# Patient Record
Sex: Female | Born: 1950
Health system: Southern US, Community
[De-identification: ages and names within clinical notes are randomized; demographics above are authoritative.]

## PROBLEM LIST (undated history)

## (undated) DIAGNOSIS — C801 Malignant (primary) neoplasm, unspecified: Secondary | ICD-10-CM

## (undated) DIAGNOSIS — I10 Essential (primary) hypertension: Secondary | ICD-10-CM

## (undated) DIAGNOSIS — Z923 Personal history of irradiation: Secondary | ICD-10-CM

## (undated) HISTORY — PX: TONSILLECTOMY: SUR1361

## (undated) HISTORY — DX: Personal history of irradiation: Z92.3

## (undated) HISTORY — DX: Malignant (primary) neoplasm, unspecified: C80.1

## (undated) HISTORY — DX: Essential (primary) hypertension: I10

---

## 1976-10-05 HISTORY — PX: OTHER SURGICAL HISTORY: SHX169

## 1999-02-20 ENCOUNTER — Other Ambulatory Visit: Admission: RE | Admit: 1999-02-20 | Discharge: 1999-02-20 | Payer: Self-pay | Admitting: *Deleted

## 2000-07-07 ENCOUNTER — Other Ambulatory Visit: Admission: RE | Admit: 2000-07-07 | Discharge: 2000-07-07 | Payer: Self-pay | Admitting: *Deleted

## 2001-09-29 ENCOUNTER — Other Ambulatory Visit: Admission: RE | Admit: 2001-09-29 | Discharge: 2001-09-29 | Payer: Self-pay | Admitting: *Deleted

## 2002-11-09 ENCOUNTER — Other Ambulatory Visit: Admission: RE | Admit: 2002-11-09 | Discharge: 2002-11-09 | Payer: Self-pay | Admitting: *Deleted

## 2003-08-02 ENCOUNTER — Ambulatory Visit (HOSPITAL_BASED_OUTPATIENT_CLINIC_OR_DEPARTMENT_OTHER): Admission: RE | Admit: 2003-08-02 | Discharge: 2003-08-02 | Payer: Self-pay | Admitting: Plastic Surgery

## 2003-08-02 ENCOUNTER — Encounter (INDEPENDENT_AMBULATORY_CARE_PROVIDER_SITE_OTHER): Payer: Self-pay | Admitting: Specialist

## 2003-08-02 ENCOUNTER — Ambulatory Visit (HOSPITAL_COMMUNITY): Admission: RE | Admit: 2003-08-02 | Discharge: 2003-08-02 | Payer: Self-pay | Admitting: Plastic Surgery

## 2003-12-20 ENCOUNTER — Other Ambulatory Visit: Admission: RE | Admit: 2003-12-20 | Discharge: 2003-12-20 | Payer: Self-pay | Admitting: *Deleted

## 2008-12-11 ENCOUNTER — Encounter: Admission: RE | Admit: 2008-12-11 | Discharge: 2008-12-11 | Payer: Self-pay | Admitting: Family Medicine

## 2008-12-11 ENCOUNTER — Other Ambulatory Visit: Admission: RE | Admit: 2008-12-11 | Discharge: 2008-12-11 | Payer: Self-pay | Admitting: Family Medicine

## 2010-08-19 ENCOUNTER — Other Ambulatory Visit: Admission: RE | Admit: 2010-08-19 | Discharge: 2010-08-19 | Payer: Self-pay | Admitting: Family Medicine

## 2010-10-05 HISTORY — PX: BREAST LUMPECTOMY: SHX2

## 2010-12-15 ENCOUNTER — Other Ambulatory Visit: Payer: Self-pay | Admitting: Radiology

## 2010-12-16 ENCOUNTER — Other Ambulatory Visit: Payer: Self-pay | Admitting: Radiology

## 2010-12-16 DIAGNOSIS — C50911 Malignant neoplasm of unspecified site of right female breast: Secondary | ICD-10-CM

## 2010-12-20 ENCOUNTER — Ambulatory Visit
Admission: RE | Admit: 2010-12-20 | Discharge: 2010-12-20 | Disposition: A | Discharge status: Home / Self Care | Payer: 59 | Source: Ambulatory Visit | Attending: Radiology | Admitting: Radiology

## 2010-12-20 DIAGNOSIS — C50911 Malignant neoplasm of unspecified site of right female breast: Secondary | ICD-10-CM

## 2010-12-20 MED ORDER — GADOBENATE DIMEGLUMINE 529 MG/ML IV SOLN
12.0000 mL | Freq: Once | INTRAVENOUS | Status: AC | PRN
  Administered 2010-12-20: 12 mL via INTRAVENOUS

## 2010-12-22 ENCOUNTER — Other Ambulatory Visit (HOSPITAL_COMMUNITY): Payer: Self-pay | Admitting: General Surgery

## 2010-12-22 DIAGNOSIS — C50919 Malignant neoplasm of unspecified site of unspecified female breast: Secondary | ICD-10-CM

## 2011-01-01 ENCOUNTER — Ambulatory Visit
Admission: RE | Admit: 2011-01-01 | Discharge: 2011-01-01 | Disposition: A | Discharge status: Home / Self Care | Payer: 59 | Source: Ambulatory Visit | Attending: General Surgery | Admitting: General Surgery

## 2011-01-01 ENCOUNTER — Encounter (HOSPITAL_BASED_OUTPATIENT_CLINIC_OR_DEPARTMENT_OTHER)
Admission: RE | Admit: 2011-01-01 | Discharge: 2011-01-01 | Disposition: A | Discharge status: Home / Self Care | Payer: 59 | Source: Ambulatory Visit | Attending: General Surgery | Admitting: General Surgery

## 2011-01-01 ENCOUNTER — Other Ambulatory Visit: Payer: Self-pay | Admitting: General Surgery

## 2011-01-01 DIAGNOSIS — Z01811 Encounter for preprocedural respiratory examination: Secondary | ICD-10-CM

## 2011-01-01 LAB — COMPREHENSIVE METABOLIC PANEL
ALT: 11 U/L (ref 0–35)
AST: 33 U/L (ref 0–37)
CO2: 29 mEq/L (ref 19–32)
Calcium: 9.6 mg/dL (ref 8.4–10.5)
Chloride: 104 mEq/L (ref 96–112)
Creatinine, Ser: 0.69 mg/dL (ref 0.4–1.2)
GFR calc non Af Amer: >60 mL/min (ref >60)
Glucose, Bld: 82 mg/dL (ref 70–99)
Sodium: 140 mEq/L (ref 135–145)
Total Bilirubin: 0.4 mg/dL (ref 0.3–1.2)

## 2011-01-01 LAB — CBC
HCT: 39.7 % (ref 36.0–46.0)
Hemoglobin: 13.3 g/dL (ref 12.0–15.0)
MCH: 31.6 pg (ref 26.0–34.0)
MCHC: 33.5 g/dL (ref 30.0–36.0)
RBC: 4.21 MIL/uL (ref 3.87–5.11)

## 2011-01-01 LAB — DIFFERENTIAL
Basophils Absolute: 0 10*3/uL (ref 0.0–0.1)
Basophils Relative: 0 % (ref 0–1)
Eosinophils Relative: 1 % (ref 0–5)
Monocytes Absolute: 0.5 10*3/uL (ref 0.1–1.0)

## 2011-01-01 LAB — CANCER ANTIGEN 27.29: CA 27.29: 26 U/mL (ref 0–39)

## 2011-01-02 ENCOUNTER — Other Ambulatory Visit: Payer: Self-pay | Admitting: General Surgery

## 2011-01-02 ENCOUNTER — Ambulatory Visit (HOSPITAL_BASED_OUTPATIENT_CLINIC_OR_DEPARTMENT_OTHER)
Admission: RE | Admit: 2011-01-02 | Discharge: 2011-01-02 | Disposition: A | Discharge status: Home / Self Care | Payer: 59 | Source: Ambulatory Visit | Attending: General Surgery | Admitting: General Surgery

## 2011-01-02 ENCOUNTER — Other Ambulatory Visit (HOSPITAL_COMMUNITY): Payer: 59

## 2011-01-02 DIAGNOSIS — C801 Malignant (primary) neoplasm, unspecified: Secondary | ICD-10-CM

## 2011-01-02 DIAGNOSIS — Z01818 Encounter for other preprocedural examination: Secondary | ICD-10-CM | POA: Insufficient documentation

## 2011-01-02 DIAGNOSIS — D0590 Unspecified type of carcinoma in situ of unspecified breast: Secondary | ICD-10-CM | POA: Insufficient documentation

## 2011-01-02 DIAGNOSIS — Z126 Encounter for screening for malignant neoplasm of bladder: Secondary | ICD-10-CM | POA: Insufficient documentation

## 2011-01-02 HISTORY — DX: Malignant (primary) neoplasm, unspecified: C80.1

## 2011-01-06 NOTE — Op Note (Signed)
NAMEGIOVANA, Jackson            ACCOUNT NO.:  [id number]  MEDICAL RECORD NO.:  [id number]          PATIENT TYPE:  LOCATION:                                 FACILITY:  PHYSICIAN:  Juanetta Gosling, MD     DATE OF BIRTH:  DATE OF PROCEDURE:  01/02/2011 DATE OF DISCHARGE:                              OPERATIVE REPORT  Pre op Dx: Right breast DCIS  Post op Dx: SAA  Procedure: Right breast wire guided lumpectomy  Surgeon: Juanetta Gosling MD  Anesthesia: General with LMA  EBL: minimal  Complications: none  Drains: none  Disposition: to PACU stable  HISTORY:  This is a 60 year old female who had a followup for a possible abnormal density and was noted to have an element that caused her pleomorphic calcifications in the right breast upper outer quadrant. This underwent stereotactic biopsy with clip placement showing high- grade DCIS ER/PR positive.  An MRI really does not show any other abnormalities that are present.  She and I discussed all of our options and elected to perform a right breast wire-guided lumpectomy.  We discussed sentinel node biopsy, but both of [place] decided that I think the best plan will be just to proceed with a wire-guided lumpectomy.  She was discussed at the Multidisciplinary Conference prior to taking her to the operating room.  PROCEDURE:  After informed consent was obtained, the patient was first taken to the [facility] where she had a wire placed and I discussed the placement with Dr. Derinda Late.  Prior to beginning the surgery, I had her mammograms available for my review in the operating room.  1 g of intravenous cefazolin was administered.  Sequential compression devices were placed on lower extremities prior to induction of anesthesia.  She was then placed under general anesthesia with an LMA.  Her right breast was prepped and draped in a standard sterile surgical fashion.  A surgical time-out was then performed.  She had a prior  right breast incision.  The wire was near there so I made an elliptical incision and removed the paddle of skin that included the old incision as well.  Cautery was then used to remove the wire as well as the surrounding tissue.  I took this down to the pectoralis muscle including the pectoralis fascia.  This was then passed off the table as a specimen.  This was marked with a paint kit.  Faxitron mammogram was taken confirming removal of the clip as well as the microcalcifications.  This was also confirmed by Dr. Derinda Late.  The margins looked clear on the mammogram.  There were no other palpable abnormalities in the breast.  I then observed hemostasis, 2 clips were placed deep, one clip was placed in each cardinal position around the breast.  I then closed the deep breast tissue with 2-0 Vicryl, the dermis with 3-0 Vicryl, the skin with 4-0 Monocryl in a subcuticular fashion.  Steri-Strips and sterile dressing were placed overlying this.  10 mL of 0.25% Marcaine were infiltrated into this breast.  She was then extubated and breast binder was placed and transferred to recovery room in stable condition.  Juanetta Gosling, MD     MCW/MEDQ  D:  01/02/2011  T:  01/03/2011  Job:  [id number]  cc:   Jeralyn Ruths, MD Dario Guardian, M.D.  Electronically Signed by Emelia Loron MD on 01/06/2011 04:00:45 PM

## 2011-01-12 ENCOUNTER — Encounter (HOSPITAL_BASED_OUTPATIENT_CLINIC_OR_DEPARTMENT_OTHER): Payer: 59 | Admitting: Oncology

## 2011-01-12 DIAGNOSIS — D0590 Unspecified type of carcinoma in situ of unspecified breast: Secondary | ICD-10-CM

## 2011-01-14 ENCOUNTER — Ambulatory Visit: Payer: 59 | Attending: Radiation Oncology | Admitting: Radiation Oncology

## 2011-01-14 DIAGNOSIS — Z170 Estrogen receptor positive status [ER+]: Secondary | ICD-10-CM | POA: Insufficient documentation

## 2011-01-14 DIAGNOSIS — Z510 Encounter for antineoplastic radiation therapy: Secondary | ICD-10-CM | POA: Insufficient documentation

## 2011-01-14 DIAGNOSIS — D0590 Unspecified type of carcinoma in situ of unspecified breast: Secondary | ICD-10-CM | POA: Insufficient documentation

## 2011-02-02 ENCOUNTER — Encounter (INDEPENDENT_AMBULATORY_CARE_PROVIDER_SITE_OTHER): Payer: Self-pay | Admitting: General Surgery

## 2011-02-20 NOTE — Op Note (Signed)
NAME:  Caitlin Jackson, Caitlin Jackson                         ACCOUNT NO.:  [id number]   MEDICAL RECORD NO.:  [id number]                   PATIENT TYPE:  AMB   LOCATION:  DSC                                  FACILITY:  [facility]   PHYSICIAN:  Brantley Persons, M.D.             DATE OF BIRTH:  31-Jan-1951   DATE OF PROCEDURE:  08/02/2003  DATE OF DISCHARGE:                                 OPERATIVE REPORT   PREOPERATIVE DIAGNOSIS:  1. Suspicious skin lesion right zygomatic cheek.  2. Suspicious skin lesion left hand dorsum.   POSTOPERATIVE DIAGNOSIS:  1. Suspicious skin lesion right zygomatic cheek.  2. Suspicious skin lesion left hand dorsum.   PROCEDURE:  1. Excision of 1.2 cm right zygomatic area suspicious skin lesion.  2. Excision of 0.5 cm left hand dorsum suspicious skin lesion.   SURGEON:  Brantley Persons, M.D.   ANESTHESIA:  1% lidocaine with epinephrine.   COMPLICATIONS:  None.   INDICATIONS FOR PROCEDURE:  The patient is a 60 year old Caucasian female  who has been referred by [facility]. Danella Deis, M.D. for evaluation of a right  cheek and left hand dorsal skin lesion.  I agreed that these lesions are  clinically suspicious and should undergo at least a biopsy, if not removal.  The patient has decided she would like to undergo an excisional biopsy of  the right zygomatic cheek area suspicious skin lesion and then will perform  a shave excision of the left hand dorsal skin suspicious lesion.   DESCRIPTION OF PROCEDURE:  The patient was brought into the minor room and  placed on the table in the supine position.  The right face was prepped with  Betadine and draped in a sterile fashion.  The skin and subcutaneous tissues  in the area of the suspicious skin lesion were then injected with 1%  lidocaine with epinephrine.  After adequate hemostasis and anesthesia had  taken effect, the procedure was begun.   Using loupe magnification, the borders of the skin lesion were identified.  At  least 1 mm margins were then marked circumferentially around the lesion.  The skin lesion was then excised, marked at the 12 o'clock position, and  passed off the table to undergo permanent pathologic section evaluation.  The wound edges were then undermined for easier closure.  The incision was  closed using 4-0 Monocryl in the dermal layer followed by a 6-0 Prolene  running baseball type stitch on the skin.  This incision was dressed with  bacitracin ointment and Xeroform followed by a small dressing.   Attention was then turned to the left hand dorsum.  The area of the  suspicious skin incision was then injected with 1% lidocaine with  epinephrine.  After adequate hemostasis and anesthesia had taken effect, the  procedure was begun.  Using loupe magnification, the borders of the skin  lesion was identified.  The lesion was then excised with the 15  blade knife  in a shave excision type of pattern.  The specimen was then passed off the  table to undergo permanent pathologic section evaluation.  The wound was  irrigated with saline irrigation.  The wound hemostasis  was then obtained using the ophthalmologic cautery instrument.  This allowed  good cautery for the patient's tissues.  Bacitracin ointment was applied for  the dressing. The patient was taught proper postoperative wound care  instructions and discharged home in stable condition.  Follow-up appointment  will be in the office tomorrow.                                               Brantley Persons, M.D.    MC/MEDQ  D:  08/02/2003  T:  08/03/2003  Job:  [id number]

## 2011-04-30 ENCOUNTER — Ambulatory Visit
Admission: RE | Admit: 2011-04-30 | Discharge: 2011-04-30 | Disposition: A | Discharge status: Home / Self Care | Payer: 59 | Source: Ambulatory Visit | Attending: Radiation Oncology | Admitting: Radiation Oncology

## 2011-05-04 ENCOUNTER — Telehealth (INDEPENDENT_AMBULATORY_CARE_PROVIDER_SITE_OTHER): Payer: Self-pay | Admitting: General Surgery

## 2011-05-06 ENCOUNTER — Telehealth (INDEPENDENT_AMBULATORY_CARE_PROVIDER_SITE_OTHER): Payer: Self-pay

## 2011-05-06 NOTE — Telephone Encounter (Signed)
Called pt after voicemail message and speaking with Dr Dwain Sarna. 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 I advised pt that  She is due for an appt at the end of September or early October./ [facility]

## 2011-06-10 ENCOUNTER — Ambulatory Visit (INDEPENDENT_AMBULATORY_CARE_PROVIDER_SITE_OTHER): Payer: 59 | Admitting: General Surgery

## 2011-06-10 ENCOUNTER — Encounter (INDEPENDENT_AMBULATORY_CARE_PROVIDER_SITE_OTHER): Payer: Self-pay | Admitting: General Surgery

## 2011-06-10 DIAGNOSIS — Z853 Personal history of malignant neoplasm of breast: Secondary | ICD-10-CM

## 2011-06-10 NOTE — Progress Notes (Signed)
Subjective:     Patient ID: Caitlin Jackson, female   DOB: 07-03-1951, 60 y.o.   MRN: [id number]  HPI This is a 60 year old female who underwent a right breast wire-guided lumpectomy in March 2012 with a 0.41 cm area of high-grade DCIS with calcifications. This was focally about 2 mm from the superior margin. Her ER and PR were both positive. Postoperatively she underwent radiation therapy which he completed on June 7. She reports only some tightness in her axilla at this point. She reports no new masses or any other concerns referable to her breasts.  Review of Systems     Objective:   Physical Exam  Constitutional: She appears well-developed and well-nourished.  Pulmonary/Chest: Right breast exhibits no inverted nipple, no mass, no nipple discharge, no skin change and no tenderness. Left breast exhibits no inverted nipple, no mass, no nipple discharge, no skin change and no tenderness. Breasts are symmetrical.    Lymphadenopathy:    She has no cervical adenopathy.       Assessment:     History of stage 0 right breast DCIS, s/p lumpectomy and radiation therapy    Plan:    She is doing very well after her lumpectomy and radiation therapy. There is no clinical evidence of any recurrence. We discussed clinical breast exams by her every month, every six-month exams by clinician in her mammogram again in March. I discussed with her continuing to do her stretching as well as her yoga would likely improve some of the tightness she feels her axilla at this point as well. She has seen Dr. Donnie Coffin has declined any antiestrogen therapy at this point as well.

## 2011-11-17 ENCOUNTER — Other Ambulatory Visit (HOSPITAL_COMMUNITY)
Admission: RE | Admit: 2011-11-17 | Discharge: 2011-11-17 | Disposition: A | Discharge status: Home / Self Care | Payer: 59 | Source: Ambulatory Visit | Attending: Family Medicine | Admitting: Family Medicine

## 2011-11-17 DIAGNOSIS — Z01419 Encounter for gynecological examination (general) (routine) without abnormal findings: Secondary | ICD-10-CM | POA: Insufficient documentation

## 2011-11-20 ENCOUNTER — Other Ambulatory Visit: Payer: Self-pay | Admitting: Family Medicine

## 2011-12-24 ENCOUNTER — Ambulatory Visit
Admission: RE | Admit: 2011-12-24 | Discharge: 2011-12-24 | Disposition: A | Discharge status: Home / Self Care | Payer: 59 | Source: Ambulatory Visit | Attending: Radiation Oncology | Admitting: Radiation Oncology

## 2011-12-24 VITALS — BP 119/77 | HR 81 | Temp 97.7°F | Resp 20 | Ht 63.0 in | Wt 130.5 lb

## 2011-12-24 DIAGNOSIS — Z853 Personal history of malignant neoplasm of breast: Secondary | ICD-10-CM

## 2011-12-24 DIAGNOSIS — C801 Malignant (primary) neoplasm, unspecified: Secondary | ICD-10-CM | POA: Insufficient documentation

## 2011-12-24 NOTE — Progress Notes (Signed)
  Radiation Oncology         [phone] ________________________________  Name: Caitlin Jackson MRN: [id number]  Date: 12/24/2011  DOB: 1951-09-05  Follow-Up Visit Note  Diagnosis:  DCIS of right breast  Interval Since Last Radiation:  9 months  Narrative:  The patient was seen in regular followup today. She was seen by Dr. Dwain Sarna in September. She has some tenderness at her incision site which she thinks is stable to improving. She is working on her yearbook at school. She had mammograms at [facility] on 12/21/2011. No evidence of recurrent disease was noted.   Current Outpatient Prescriptions  Medication Sig Dispense Refill  . ASPIRIN PO Take by mouth. OCCASIONAL        Patient Active Problem List  Diagnoses  . History of breast cancer in female    OBJECTIVE: BP 119/77  Pulse 81  Temp(Src) 97.7 F (36.5 C) (Oral)  Resp 20  Ht 5\' 3"  (1.6 m)  Wt 130 lb 8 oz (59.194 kg)  BMI 23.12 kg/m2  Appearance: alert, well appearing, and in no distress. Breasts: left breast normal without mass, skin or nipple changes or axillary nodes. Right breast shows a well healed lumpectomy scar.  Fibrotic change is palpable below the scar. No tenderness or recurrent mass is palpated. No axillary adenopathy.  Impression:  Ductal carcinoma in situ of the right breast status post breast conservation with no evidence of disease  Plan:  Caitlin Jackson looks great. I have scheduled her for followup in a year. She noticed contact me with any questions or concerns. Have also scheduled her for bilateral mammograms at that time. She will follow with Dr. Dwain Sarna in March.

## 2011-12-24 NOTE — Progress Notes (Signed)
Pt states she "still has soreness in incisional area of right breast".  Pt had mammogram last week, Dr Yolanda Bonine.

## 2012-02-12 ENCOUNTER — Encounter (INDEPENDENT_AMBULATORY_CARE_PROVIDER_SITE_OTHER): Payer: Self-pay

## 2012-06-07 ENCOUNTER — Encounter (INDEPENDENT_AMBULATORY_CARE_PROVIDER_SITE_OTHER): Payer: Self-pay | Admitting: General Surgery

## 2012-06-07 ENCOUNTER — Ambulatory Visit (INDEPENDENT_AMBULATORY_CARE_PROVIDER_SITE_OTHER): Payer: 59 | Admitting: General Surgery

## 2012-06-07 VITALS — BP 122/80 | Resp 16 | Ht 63.0 in | Wt 131.0 lb

## 2012-06-07 DIAGNOSIS — Z853 Personal history of malignant neoplasm of breast: Secondary | ICD-10-CM

## 2012-06-07 NOTE — Patient Instructions (Signed)

## 2012-06-08 NOTE — Progress Notes (Signed)
Subjective:     Patient ID: Caitlin Jackson, female   DOB: 05/20/1951, 60 y.o.   MRN: [id number]  HPI This is a 61 year old female who underwent a right breast wire-guided lumpectomy in March 2012 with a 0.41 cm area of high-grade DCIS with calcifications. This was focally about 2 mm from the superior margin. It is both ER and PR were both positive. Postoperatively she underwent radiation therapy. She reports only some tightness in her axilla at this point. She reports no new masses or any other concerns referable to her breasts. She underwent a mmg in 12/2011 that was BiRads 2 and recommended annual follow up.  She has had a good year and has started school again.   Review of Systems     Objective:   Physical Exam  Vitals reviewed. Constitutional: She appears well-developed and well-nourished.  Pulmonary/Chest: Right breast exhibits no inverted nipple, no mass, no nipple discharge, no skin change and no tenderness. Left breast exhibits no inverted nipple, no mass, no nipple discharge, no skin change and no tenderness. Breasts are symmetrical.    Lymphadenopathy:    She has no cervical adenopathy.    She has no axillary adenopathy.       Right: No supraclavicular adenopathy present.       Left: No supraclavicular adenopathy present.       Assessment:    History of stage 0 right breast cancer     Plan:        She is doing very well after her lumpectomy and radiation therapy. There is no clinical evidence of any recurrence. We discussed clinical breast exams by her every month, every six-month exams by clinician in her mammogram again in March. I will see her back in one year.

## 2012-12-27 ENCOUNTER — Encounter: Payer: Self-pay | Admitting: Radiation Oncology

## 2012-12-27 DIAGNOSIS — Z923 Personal history of irradiation: Secondary | ICD-10-CM | POA: Insufficient documentation

## 2012-12-29 ENCOUNTER — Encounter: Payer: Self-pay | Admitting: Radiation Oncology

## 2012-12-29 ENCOUNTER — Ambulatory Visit
Admission: RE | Admit: 2012-12-29 | Discharge: 2012-12-29 | Disposition: A | Discharge status: Home / Self Care | Payer: 59 | Source: Ambulatory Visit | Attending: Radiation Oncology | Admitting: Radiation Oncology

## 2012-12-29 ENCOUNTER — Ambulatory Visit: Payer: Self-pay | Admitting: Radiation Oncology

## 2012-12-29 ENCOUNTER — Ambulatory Visit: Payer: 59 | Admitting: Radiation Oncology

## 2012-12-29 VITALS — BP 118/74 | HR 68 | Temp 98.0°F | Resp 20 | Wt 131.1 lb

## 2012-12-29 DIAGNOSIS — Z853 Personal history of malignant neoplasm of breast: Secondary | ICD-10-CM

## 2012-12-29 DIAGNOSIS — C801 Malignant (primary) neoplasm, unspecified: Secondary | ICD-10-CM

## 2012-12-29 NOTE — Progress Notes (Signed)
Pt has no c/o today, does report "tightness and occasional soreness" in her right axilla. She continues to teach art full time. Pt had mammogram 12/21/12.

## 2012-12-29 NOTE — Progress Notes (Signed)
   Department of Radiation Oncology  Phone:  [phone] Fax:        [phone]   Name: Caitlin Jackson MRN: [id number]  DOB: March 15, 1951  Date: 12/29/2012  Follow Up Visit Note  Diagnosis: DCIS of the right breast  Summary and Interval since last radiation: 2 years  Interval History: Caitlin Jackson presents today for routine followup.  It mammogram last week. She is not on tamoxifen. She continues to teach. She has no breast related complaints. She says occasionally she has some stretching of the upper outer portion of the right breast.  Allergies: No Known Allergies  Medications:  Current Outpatient Prescriptions  Medication Sig Dispense Refill  . ASPIRIN PO Take by mouth. OCCASIONAL       . ibuprofen (ADVIL,MOTRIN) 200 MG tablet Take 200 mg by mouth every 6 (six) hours as needed for pain.       No current facility-administered medications for this encounter.    Physical Exam:  Filed Vitals:   12/29/12 1344  BP: 118/74  Pulse: 68  Temp: 98 F (36.7 C)  Resp: 20   she is a pleasant female in no distress sitting comfortably examining table. She has no palpable cervical or supraclavicular adenopathy. She has no palpable abnormalities of the left or right breast. There is a healing scar in the upper outer quadrant of the right breast. She has no palpable cervical supraclavicular or axillary adenopathy bilaterally  IMPRESSION: Caitlin Jackson is a 62 y.o. female status post breast conservation for stage 0 breast cancer with no evidence of disease  PLAN:  The patient looks great. She will see Dr. Dwain Sarna in 6 months and I will see her in one year. I will also order her mammograms for one year from now. She knows she can always contact us with any questions or concerns.    Lurline Hare, MD

## 2013-01-25 ENCOUNTER — Ambulatory Visit: Payer: 59

## 2013-08-30 ENCOUNTER — Other Ambulatory Visit: Payer: Self-pay | Admitting: Dermatology

## 2013-09-21 ENCOUNTER — Encounter (INDEPENDENT_AMBULATORY_CARE_PROVIDER_SITE_OTHER): Payer: Self-pay | Admitting: General Surgery

## 2013-09-21 ENCOUNTER — Ambulatory Visit (INDEPENDENT_AMBULATORY_CARE_PROVIDER_SITE_OTHER): Payer: Commercial Managed Care - PPO | Admitting: General Surgery

## 2013-09-21 VITALS — BP 118/72 | HR 74 | Resp 18 | Ht 63.0 in | Wt 128.0 lb

## 2013-09-21 DIAGNOSIS — Z853 Personal history of malignant neoplasm of breast: Secondary | ICD-10-CM

## 2013-09-21 NOTE — Progress Notes (Signed)
Subjective:     Patient ID: Caitlin Jackson, female   DOB: [birth year], 62 y.o.   MRN: [id number]  HPI 78 yof who underwent right breast lumpectomy for stage 0 breast cancer.  She is doing well today without any real complaints.  She has some tightness in her right axilla near scar if not stretching and doing yoga.  She had mm in 3/14 that was fine and due for follow up in one year.  She does not have any nipple discharge and or any real concerns in her breast exam.  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    Review of Systems  Constitutional: Negative for fever, chills and unexpected weight change.  HENT: Negative for congestion, hearing loss, sore throat, trouble swallowing and voice change.   Eyes: Negative for visual disturbance.  Respiratory: Negative for cough and wheezing.   Cardiovascular: Negative for chest pain, palpitations and leg swelling.  Gastrointestinal: Negative for nausea, vomiting, abdominal pain, diarrhea, constipation, blood in stool, abdominal distention and anal bleeding.  Genitourinary: Negative for hematuria, vaginal bleeding and difficulty urinating.  Musculoskeletal: Negative for arthralgias.  Skin: Negative for rash and wound.  Neurological: Negative for seizures, syncope and headaches.  Hematological: Negative for adenopathy. Does not bruise/bleed easily.  Psychiatric/Behavioral: Negative for confusion.       Objective:   Physical Exam  Vitals reviewed. Constitutional: She appears well-developed and well-nourished.  Pulmonary/Chest: Right breast exhibits no inverted nipple, no mass, no nipple discharge, no skin change and no tenderness. Left breast exhibits no inverted nipple, no mass, no nipple discharge, no skin change and no tenderness.    Lymphadenopathy:    She has no cervical adenopathy.    She has no axillary adenopathy.       Right: No supraclavicular adenopathy present.       Left: No supraclavicular adenopathy present.       Assessment:      History stage 0 right breast cancer    Plan:     She has no clinical evidence of recurrent and is up to date on mm.  I do think it is a good idea for her to undergo tomosynthesis in follow up for mm.  She will continue own self exams.  She is going to massage her scar and will try to do her yoga more.  I will plan on seeing her back in one year and she sees Dr Michell Heinrich in 6 months.

## 2013-09-21 NOTE — Patient Instructions (Signed)

## 2013-12-19 ENCOUNTER — Emergency Department (HOSPITAL_COMMUNITY)
Admission: EM | Admit: 2013-12-19 | Discharge: 2013-12-19 | Discharge status: Absent without leave | Payer: 59 | Source: Home / Self Care

## 2013-12-27 ENCOUNTER — Telehealth: Payer: Self-pay | Admitting: *Deleted

## 2013-12-27 NOTE — Telephone Encounter (Signed)
Called patient to ask question, lvm for return call

## 2013-12-28 ENCOUNTER — Ambulatory Visit: Payer: 59 | Admitting: Radiation Oncology

## 2013-12-29 ENCOUNTER — Ambulatory Visit
Admission: RE | Admit: 2013-12-29 | Discharge: 2013-12-29 | Disposition: A | Discharge status: Home / Self Care | Payer: 59 | Source: Ambulatory Visit | Attending: Radiation Oncology | Admitting: Radiation Oncology

## 2013-12-29 VITALS — BP 111/78 | HR 76 | Temp 98.3°F | Wt 130.4 lb

## 2013-12-29 DIAGNOSIS — C801 Malignant (primary) neoplasm, unspecified: Secondary | ICD-10-CM

## 2013-12-29 NOTE — Progress Notes (Signed)
   Department of Radiation Oncology  Phone:  [phone] Fax:        [phone]   Name: Caitlin Jackson MRN: [id number]  DOB: 01/09/1951  Date: 12/29/2013  Follow Up Visit Note  Diagnosis: DCIS of the right breast  Summary and Interval since last radiation: 3 years  Interval History: Caitlin Jackson presents today for routine followup.  She had a negative mammogram earlier this week. She is not on tamoxifen. She continues to teach. She has no breast related complaints. She has stable soreness along the scar.   Allergies: No Known Allergies  Medications:  Current Outpatient Prescriptions  Medication Sig Dispense Refill  . ASPIRIN PO Take by mouth. OCCASIONAL       . ibuprofen (ADVIL,MOTRIN) 200 MG tablet Take 200 mg by mouth every 6 (six) hours as needed for pain.      . pseudoephedrine (SUDAFED) 60 MG tablet Take 60 mg by mouth every 6 (six) hours as needed for congestion.       No current facility-administered medications for this encounter.    Physical Exam:  Filed Vitals:   12/29/13 1552  BP: 111/78  Pulse: 76  Temp: 98.3 F (36.8 C)   she is a pleasant female in no distress sitting comfortably examining table. She has no palpable cervical or supraclavicular adenopathy. She has no palpable abnormalities of the left or right breast. There is a well heaed scar in the upper outer quadrant of the right breast. She has no palpable cervical supraclavicular or axillary adenopathy bilaterally  IMPRESSION: Caitlin Jackson is a 63 y.o. female status post breast conservation for stage 0 breast cancer with no evidence of disease  PLAN:  The patient looks great. She will see Dr. Donne Hazel in 6 months and I will see her in one year. I will also order her mammograms for one year from now. She knows she can always contact us with any questions or concerns.    Thea Silversmith, MD

## 2013-12-29 NOTE — Progress Notes (Addendum)
Patient for follow up right breast cancer radiation completed from 01/26/2011 to 03/12/2011.Patient had bilateral diagnostic mammogram 3D/2D  performed at [facility] revealed no evidence of malignancy..Patient had mohr's surgery on bridge of nose positive for squamous cell carcinoma otherwise has been healthy over the last year.

## 2014-01-01 ENCOUNTER — Telehealth: Payer: Self-pay | Admitting: *Deleted

## 2014-01-01 NOTE — Telephone Encounter (Signed)
Called patient to inform of mammogram, lvm for a return call 

## 2016-03-30 ENCOUNTER — Telehealth: Payer: Self-pay | Admitting: *Deleted

## 2016-03-30 ENCOUNTER — Encounter: Payer: Self-pay | Admitting: *Deleted

## 2016-03-30 NOTE — Telephone Encounter (Signed)
Called Caitlin Jackson back to let her know that Dr, Sondra Come would give her a call back after reviewing her mammogram and U/S from today.

## 2016-03-30 NOTE — Telephone Encounter (Signed)
Called [facility] they need a form completed for a diagnostic bilateral mammogram 12 month post lumpectomy follow up.  Will re-fax the form now to [phone]

## 2016-03-30 NOTE — Progress Notes (Signed)
Documented on the wrong chart that Dr. Sondra Come would call back.

## 2016-03-31 DIAGNOSIS — D485 Neoplasm of uncertain behavior of skin: Secondary | ICD-10-CM | POA: Diagnosis not present

## 2016-03-31 DIAGNOSIS — D1800 Hemangioma unspecified site: Secondary | ICD-10-CM | POA: Diagnosis not present

## 2016-03-31 DIAGNOSIS — D225 Melanocytic nevi of trunk: Secondary | ICD-10-CM | POA: Diagnosis not present

## 2016-03-31 DIAGNOSIS — L814 Other melanin hyperpigmentation: Secondary | ICD-10-CM | POA: Diagnosis not present

## 2016-03-31 DIAGNOSIS — Z85828 Personal history of other malignant neoplasm of skin: Secondary | ICD-10-CM | POA: Diagnosis not present

## 2016-03-31 DIAGNOSIS — L821 Other seborrheic keratosis: Secondary | ICD-10-CM | POA: Diagnosis not present

## 2016-04-02 DIAGNOSIS — L578 Other skin changes due to chronic exposure to nonionizing radiation: Secondary | ICD-10-CM | POA: Diagnosis not present

## 2016-05-13 ENCOUNTER — Other Ambulatory Visit (HOSPITAL_COMMUNITY)
Admission: RE | Admit: 2016-05-13 | Discharge: 2016-05-13 | Disposition: A | Discharge status: Home / Self Care | Payer: Commercial Managed Care - PPO | Source: Ambulatory Visit | Attending: Physician Assistant | Admitting: Physician Assistant

## 2016-05-13 ENCOUNTER — Other Ambulatory Visit: Payer: Self-pay | Admitting: Physician Assistant

## 2016-05-13 DIAGNOSIS — Z0000 Encounter for general adult medical examination without abnormal findings: Secondary | ICD-10-CM | POA: Diagnosis present

## 2016-05-13 DIAGNOSIS — Z1151 Encounter for screening for human papillomavirus (HPV): Secondary | ICD-10-CM | POA: Diagnosis not present

## 2016-05-18 LAB — CYTOLOGY - PAP

## 2017-04-16 DIAGNOSIS — H00021 Hordeolum internum right upper eyelid: Secondary | ICD-10-CM | POA: Diagnosis not present

## 2017-04-28 DIAGNOSIS — H00011 Hordeolum externum right upper eyelid: Secondary | ICD-10-CM | POA: Diagnosis not present

## 2017-05-11 DIAGNOSIS — R928 Other abnormal and inconclusive findings on diagnostic imaging of breast: Secondary | ICD-10-CM | POA: Diagnosis not present

## 2017-05-11 DIAGNOSIS — Z853 Personal history of malignant neoplasm of breast: Secondary | ICD-10-CM | POA: Diagnosis not present

## 2017-05-12 DIAGNOSIS — H00011 Hordeolum externum right upper eyelid: Secondary | ICD-10-CM | POA: Diagnosis not present

## 2017-06-21 DIAGNOSIS — Z85828 Personal history of other malignant neoplasm of skin: Secondary | ICD-10-CM | POA: Diagnosis not present

## 2017-06-21 DIAGNOSIS — D1800 Hemangioma unspecified site: Secondary | ICD-10-CM | POA: Diagnosis not present

## 2017-06-21 DIAGNOSIS — L821 Other seborrheic keratosis: Secondary | ICD-10-CM | POA: Diagnosis not present

## 2017-06-21 DIAGNOSIS — Z23 Encounter for immunization: Secondary | ICD-10-CM | POA: Diagnosis not present

## 2017-06-21 DIAGNOSIS — L680 Hirsutism: Secondary | ICD-10-CM | POA: Diagnosis not present

## 2017-06-21 DIAGNOSIS — D225 Melanocytic nevi of trunk: Secondary | ICD-10-CM | POA: Diagnosis not present

## 2017-06-21 DIAGNOSIS — B078 Other viral warts: Secondary | ICD-10-CM | POA: Diagnosis not present

## 2017-06-21 DIAGNOSIS — L814 Other melanin hyperpigmentation: Secondary | ICD-10-CM | POA: Diagnosis not present

## 2017-10-21 DIAGNOSIS — J039 Acute tonsillitis, unspecified: Secondary | ICD-10-CM | POA: Diagnosis not present

## 2017-10-25 DIAGNOSIS — H01006 Unspecified blepharitis left eye, unspecified eyelid: Secondary | ICD-10-CM | POA: Diagnosis not present

## 2017-10-25 DIAGNOSIS — H00011 Hordeolum externum right upper eyelid: Secondary | ICD-10-CM | POA: Diagnosis not present

## 2018-02-15 DIAGNOSIS — E785 Hyperlipidemia, unspecified: Secondary | ICD-10-CM | POA: Diagnosis not present

## 2018-02-15 DIAGNOSIS — M8580 Other specified disorders of bone density and structure, unspecified site: Secondary | ICD-10-CM | POA: Diagnosis not present

## 2018-02-15 DIAGNOSIS — Z23 Encounter for immunization: Secondary | ICD-10-CM | POA: Diagnosis not present

## 2018-02-15 DIAGNOSIS — Z0000 Encounter for general adult medical examination without abnormal findings: Secondary | ICD-10-CM | POA: Diagnosis not present

## 2018-04-11 DIAGNOSIS — M8588 Other specified disorders of bone density and structure, other site: Secondary | ICD-10-CM | POA: Diagnosis not present

## 2018-05-12 DIAGNOSIS — Z1231 Encounter for screening mammogram for malignant neoplasm of breast: Secondary | ICD-10-CM | POA: Diagnosis not present

## 2018-05-12 DIAGNOSIS — Z853 Personal history of malignant neoplasm of breast: Secondary | ICD-10-CM | POA: Diagnosis not present

## 2018-05-25 DIAGNOSIS — E785 Hyperlipidemia, unspecified: Secondary | ICD-10-CM | POA: Diagnosis not present

## 2018-06-20 DIAGNOSIS — L814 Other melanin hyperpigmentation: Secondary | ICD-10-CM | POA: Diagnosis not present

## 2018-06-20 DIAGNOSIS — L821 Other seborrheic keratosis: Secondary | ICD-10-CM | POA: Diagnosis not present

## 2018-06-20 DIAGNOSIS — D1800 Hemangioma unspecified site: Secondary | ICD-10-CM | POA: Diagnosis not present

## 2018-06-20 DIAGNOSIS — Z85828 Personal history of other malignant neoplasm of skin: Secondary | ICD-10-CM | POA: Diagnosis not present

## 2018-06-20 DIAGNOSIS — D225 Melanocytic nevi of trunk: Secondary | ICD-10-CM | POA: Diagnosis not present

## 2018-09-14 DIAGNOSIS — Z23 Encounter for immunization: Secondary | ICD-10-CM | POA: Diagnosis not present

## 2019-06-05 DIAGNOSIS — Z1211 Encounter for screening for malignant neoplasm of colon: Secondary | ICD-10-CM | POA: Diagnosis not present

## 2019-06-05 DIAGNOSIS — E785 Hyperlipidemia, unspecified: Secondary | ICD-10-CM | POA: Diagnosis not present

## 2019-06-15 DIAGNOSIS — Z1231 Encounter for screening mammogram for malignant neoplasm of breast: Secondary | ICD-10-CM | POA: Diagnosis not present

## 2019-06-15 DIAGNOSIS — Z853 Personal history of malignant neoplasm of breast: Secondary | ICD-10-CM | POA: Diagnosis not present

## 2019-06-22 DIAGNOSIS — D225 Melanocytic nevi of trunk: Secondary | ICD-10-CM | POA: Diagnosis not present

## 2019-06-22 DIAGNOSIS — Z85828 Personal history of other malignant neoplasm of skin: Secondary | ICD-10-CM | POA: Diagnosis not present

## 2019-06-22 DIAGNOSIS — Z23 Encounter for immunization: Secondary | ICD-10-CM | POA: Diagnosis not present

## 2019-06-22 DIAGNOSIS — L821 Other seborrheic keratosis: Secondary | ICD-10-CM | POA: Diagnosis not present

## 2019-06-22 DIAGNOSIS — L814 Other melanin hyperpigmentation: Secondary | ICD-10-CM | POA: Diagnosis not present

## 2019-06-22 DIAGNOSIS — L03011 Cellulitis of right finger: Secondary | ICD-10-CM | POA: Diagnosis not present

## 2019-06-22 DIAGNOSIS — L820 Inflamed seborrheic keratosis: Secondary | ICD-10-CM | POA: Diagnosis not present

## 2019-06-23 DIAGNOSIS — Z23 Encounter for immunization: Secondary | ICD-10-CM | POA: Diagnosis not present

## 2019-06-23 DIAGNOSIS — Z1211 Encounter for screening for malignant neoplasm of colon: Secondary | ICD-10-CM | POA: Diagnosis not present

## 2019-06-23 DIAGNOSIS — E785 Hyperlipidemia, unspecified: Secondary | ICD-10-CM | POA: Diagnosis not present

## 2019-07-28 DIAGNOSIS — H35052 Retinal neovascularization, unspecified, left eye: Secondary | ICD-10-CM | POA: Diagnosis not present

## 2019-07-28 DIAGNOSIS — H3562 Retinal hemorrhage, left eye: Secondary | ICD-10-CM | POA: Diagnosis not present

## 2019-07-28 DIAGNOSIS — H4312 Vitreous hemorrhage, left eye: Secondary | ICD-10-CM | POA: Diagnosis not present

## 2019-07-28 DIAGNOSIS — H35033 Hypertensive retinopathy, bilateral: Secondary | ICD-10-CM | POA: Diagnosis not present

## 2019-07-28 DIAGNOSIS — H35373 Puckering of macula, bilateral: Secondary | ICD-10-CM | POA: Diagnosis not present

## 2019-08-16 DIAGNOSIS — H43813 Vitreous degeneration, bilateral: Secondary | ICD-10-CM | POA: Diagnosis not present

## 2019-08-16 DIAGNOSIS — H3562 Retinal hemorrhage, left eye: Secondary | ICD-10-CM | POA: Diagnosis not present

## 2019-08-16 DIAGNOSIS — H35373 Puckering of macula, bilateral: Secondary | ICD-10-CM | POA: Diagnosis not present

## 2019-08-16 DIAGNOSIS — H35033 Hypertensive retinopathy, bilateral: Secondary | ICD-10-CM | POA: Diagnosis not present

## 2019-08-25 DIAGNOSIS — H3562 Retinal hemorrhage, left eye: Secondary | ICD-10-CM | POA: Diagnosis not present

## 2019-08-25 DIAGNOSIS — H35371 Puckering of macula, right eye: Secondary | ICD-10-CM | POA: Diagnosis not present

## 2019-08-25 DIAGNOSIS — H35052 Retinal neovascularization, unspecified, left eye: Secondary | ICD-10-CM | POA: Diagnosis not present

## 2019-08-25 DIAGNOSIS — H4312 Vitreous hemorrhage, left eye: Secondary | ICD-10-CM | POA: Diagnosis not present

## 2019-08-28 DIAGNOSIS — H35052 Retinal neovascularization, unspecified, left eye: Secondary | ICD-10-CM | POA: Diagnosis not present

## 2019-10-05 DIAGNOSIS — H4312 Vitreous hemorrhage, left eye: Secondary | ICD-10-CM | POA: Diagnosis not present

## 2019-10-13 DIAGNOSIS — H35371 Puckering of macula, right eye: Secondary | ICD-10-CM | POA: Diagnosis not present

## 2019-10-13 DIAGNOSIS — H4312 Vitreous hemorrhage, left eye: Secondary | ICD-10-CM | POA: Diagnosis not present

## 2019-11-03 DIAGNOSIS — H35052 Retinal neovascularization, unspecified, left eye: Secondary | ICD-10-CM | POA: Diagnosis not present

## 2019-11-09 ENCOUNTER — Other Ambulatory Visit: Payer: Self-pay

## 2019-11-09 DIAGNOSIS — Z20822 Contact with and (suspected) exposure to covid-19: Secondary | ICD-10-CM

## 2019-11-10 LAB — NOVEL CORONAVIRUS, NAA: SARS-CoV-2, NAA: NOT DETECTED

## 2019-11-26 ENCOUNTER — Ambulatory Visit: Payer: Medicare Other | Attending: Internal Medicine

## 2019-11-26 DIAGNOSIS — Z23 Encounter for immunization: Secondary | ICD-10-CM | POA: Insufficient documentation

## 2019-11-26 NOTE — Progress Notes (Signed)
   Covid-19 Vaccination Clinic  Name:  Caitlin Jackson    MRN: [id number] DOB: [birth year]  11/26/2019  Caitlin Jackson was observed post Covid-19 immunization for 15 minutes without incidence. She was provided with Vaccine Information Sheet and instruction to access the V-Safe system.   Caitlin Jackson was instructed to call 911 with any severe reactions post vaccine: Marland Kitchen Difficulty breathing  . Swelling of your face and throat  . A fast heartbeat  . A bad rash all over your body  . Dizziness and weakness    Immunizations Administered    Name Date Dose VIS Date Route   Pfizer COVID-19 Vaccine 11/26/2019  9:07 AM 0.3 mL 09/15/2019 Intramuscular   Manufacturer: [place]   Lot: [id number]   [place]: [id number]

## 2019-12-01 DIAGNOSIS — H35371 Puckering of macula, right eye: Secondary | ICD-10-CM | POA: Diagnosis not present

## 2019-12-01 DIAGNOSIS — H35052 Retinal neovascularization, unspecified, left eye: Secondary | ICD-10-CM | POA: Diagnosis not present

## 2019-12-19 ENCOUNTER — Ambulatory Visit: Payer: Medicare Other | Attending: Internal Medicine

## 2019-12-19 DIAGNOSIS — Z23 Encounter for immunization: Secondary | ICD-10-CM

## 2019-12-19 NOTE — Progress Notes (Signed)
   Covid-19 Vaccination Clinic  Name:  Caitlin Jackson    MRN: [id number] DOB: 1951/06/04  12/19/2019  Caitlin Jackson was observed post Covid-19 immunization for 15 minutes without incident. She was provided with Vaccine Information Sheet and instruction to access the V-Safe system.   Caitlin Jackson was instructed to call 911 with any severe reactions post vaccine: Marland Kitchen Difficulty breathing  . Swelling of face and throat  . A fast heartbeat  . A bad rash all over body  . Dizziness and weakness   Immunizations Administered    Name Date Dose VIS Date Route   Pfizer COVID-19 Vaccine 12/19/2019  3:35 PM 0.3 mL 09/15/2019 Intramuscular   Manufacturer: [place]   Lot: [id number]   [place]: [id number]

## 2020-01-10 DIAGNOSIS — H43811 Vitreous degeneration, right eye: Secondary | ICD-10-CM | POA: Diagnosis not present

## 2020-01-10 DIAGNOSIS — H35373 Puckering of macula, bilateral: Secondary | ICD-10-CM | POA: Diagnosis not present

## 2020-01-10 DIAGNOSIS — H35033 Hypertensive retinopathy, bilateral: Secondary | ICD-10-CM | POA: Diagnosis not present

## 2020-01-10 DIAGNOSIS — H35052 Retinal neovascularization, unspecified, left eye: Secondary | ICD-10-CM | POA: Diagnosis not present

## 2020-01-11 ENCOUNTER — Other Ambulatory Visit: Payer: Self-pay | Admitting: Family Medicine

## 2020-01-11 DIAGNOSIS — R1012 Left upper quadrant pain: Secondary | ICD-10-CM | POA: Diagnosis not present

## 2020-01-12 ENCOUNTER — Other Ambulatory Visit: Payer: Self-pay

## 2020-01-12 ENCOUNTER — Ambulatory Visit
Admission: RE | Admit: 2020-01-12 | Discharge: 2020-01-12 | Disposition: A | Discharge status: Home / Self Care | Payer: Medicare Other | Source: Ambulatory Visit | Attending: Family Medicine | Admitting: Family Medicine

## 2020-01-12 DIAGNOSIS — R1032 Left lower quadrant pain: Secondary | ICD-10-CM | POA: Diagnosis not present

## 2020-01-12 DIAGNOSIS — R1012 Left upper quadrant pain: Secondary | ICD-10-CM

## 2020-01-12 MED ORDER — IOPAMIDOL (ISOVUE-300) INJECTION 61%
100.0000 mL | Freq: Once | INTRAVENOUS | Status: AC | PRN
  Administered 2020-01-12: 13:00:00 100 mL via INTRAVENOUS

## 2020-01-18 DIAGNOSIS — H2513 Age-related nuclear cataract, bilateral: Secondary | ICD-10-CM | POA: Diagnosis not present

## 2020-01-18 DIAGNOSIS — H3562 Retinal hemorrhage, left eye: Secondary | ICD-10-CM | POA: Diagnosis not present

## 2020-01-18 DIAGNOSIS — H5203 Hypermetropia, bilateral: Secondary | ICD-10-CM | POA: Diagnosis not present

## 2020-02-28 DIAGNOSIS — H35373 Puckering of macula, bilateral: Secondary | ICD-10-CM | POA: Diagnosis not present

## 2020-02-28 DIAGNOSIS — H35052 Retinal neovascularization, unspecified, left eye: Secondary | ICD-10-CM | POA: Diagnosis not present

## 2020-02-28 DIAGNOSIS — H3562 Retinal hemorrhage, left eye: Secondary | ICD-10-CM | POA: Diagnosis not present

## 2020-02-28 DIAGNOSIS — H43811 Vitreous degeneration, right eye: Secondary | ICD-10-CM | POA: Diagnosis not present

## 2020-04-23 DIAGNOSIS — H10013 Acute follicular conjunctivitis, bilateral: Secondary | ICD-10-CM | POA: Diagnosis not present

## 2020-04-23 DIAGNOSIS — B309 Viral conjunctivitis, unspecified: Secondary | ICD-10-CM | POA: Diagnosis not present

## 2020-04-30 DIAGNOSIS — B309 Viral conjunctivitis, unspecified: Secondary | ICD-10-CM | POA: Diagnosis not present

## 2020-04-30 DIAGNOSIS — H10013 Acute follicular conjunctivitis, bilateral: Secondary | ICD-10-CM | POA: Diagnosis not present

## 2020-05-09 DIAGNOSIS — H2512 Age-related nuclear cataract, left eye: Secondary | ICD-10-CM | POA: Diagnosis not present

## 2020-05-22 DIAGNOSIS — H35033 Hypertensive retinopathy, bilateral: Secondary | ICD-10-CM | POA: Diagnosis not present

## 2020-05-22 DIAGNOSIS — H35052 Retinal neovascularization, unspecified, left eye: Secondary | ICD-10-CM | POA: Diagnosis not present

## 2020-05-22 DIAGNOSIS — H35373 Puckering of macula, bilateral: Secondary | ICD-10-CM | POA: Diagnosis not present

## 2020-05-22 DIAGNOSIS — H43811 Vitreous degeneration, right eye: Secondary | ICD-10-CM | POA: Diagnosis not present

## 2020-06-20 DIAGNOSIS — Z1231 Encounter for screening mammogram for malignant neoplasm of breast: Secondary | ICD-10-CM | POA: Diagnosis not present

## 2020-07-01 DIAGNOSIS — Z85828 Personal history of other malignant neoplasm of skin: Secondary | ICD-10-CM | POA: Diagnosis not present

## 2020-07-01 DIAGNOSIS — L814 Other melanin hyperpigmentation: Secondary | ICD-10-CM | POA: Diagnosis not present

## 2020-07-01 DIAGNOSIS — L570 Actinic keratosis: Secondary | ICD-10-CM | POA: Diagnosis not present

## 2020-07-01 DIAGNOSIS — L821 Other seborrheic keratosis: Secondary | ICD-10-CM | POA: Diagnosis not present

## 2020-07-01 DIAGNOSIS — L578 Other skin changes due to chronic exposure to nonionizing radiation: Secondary | ICD-10-CM | POA: Diagnosis not present

## 2020-07-01 DIAGNOSIS — B078 Other viral warts: Secondary | ICD-10-CM | POA: Diagnosis not present

## 2020-07-01 DIAGNOSIS — D225 Melanocytic nevi of trunk: Secondary | ICD-10-CM | POA: Diagnosis not present

## 2020-07-01 DIAGNOSIS — W57XXXA Bitten or stung by nonvenomous insect and other nonvenomous arthropods, initial encounter: Secondary | ICD-10-CM | POA: Diagnosis not present

## 2020-07-10 DIAGNOSIS — Z23 Encounter for immunization: Secondary | ICD-10-CM | POA: Diagnosis not present

## 2020-07-24 DIAGNOSIS — H35052 Retinal neovascularization, unspecified, left eye: Secondary | ICD-10-CM | POA: Diagnosis not present

## 2020-07-24 DIAGNOSIS — H35373 Puckering of macula, bilateral: Secondary | ICD-10-CM | POA: Diagnosis not present

## 2020-09-25 DIAGNOSIS — H35372 Puckering of macula, left eye: Secondary | ICD-10-CM | POA: Diagnosis not present

## 2020-09-25 DIAGNOSIS — H35033 Hypertensive retinopathy, bilateral: Secondary | ICD-10-CM | POA: Diagnosis not present

## 2020-09-25 DIAGNOSIS — H43811 Vitreous degeneration, right eye: Secondary | ICD-10-CM | POA: Diagnosis not present

## 2020-11-12 DIAGNOSIS — H35052 Retinal neovascularization, unspecified, left eye: Secondary | ICD-10-CM | POA: Diagnosis not present

## 2020-11-12 DIAGNOSIS — Z9889 Other specified postprocedural states: Secondary | ICD-10-CM | POA: Diagnosis not present

## 2020-11-12 DIAGNOSIS — Z961 Presence of intraocular lens: Secondary | ICD-10-CM | POA: Diagnosis not present

## 2020-11-12 DIAGNOSIS — H35373 Puckering of macula, bilateral: Secondary | ICD-10-CM | POA: Diagnosis not present

## 2020-11-12 DIAGNOSIS — H1012 Acute atopic conjunctivitis, left eye: Secondary | ICD-10-CM | POA: Diagnosis not present

## 2020-11-12 DIAGNOSIS — H43811 Vitreous degeneration, right eye: Secondary | ICD-10-CM | POA: Diagnosis not present

## 2020-11-12 DIAGNOSIS — H0102A Squamous blepharitis right eye, upper and lower eyelids: Secondary | ICD-10-CM | POA: Diagnosis not present

## 2020-11-12 DIAGNOSIS — H0102B Squamous blepharitis left eye, upper and lower eyelids: Secondary | ICD-10-CM | POA: Diagnosis not present

## 2020-11-12 DIAGNOSIS — H26492 Other secondary cataract, left eye: Secondary | ICD-10-CM | POA: Diagnosis not present

## 2020-11-12 DIAGNOSIS — H3562 Retinal hemorrhage, left eye: Secondary | ICD-10-CM | POA: Diagnosis not present

## 2020-11-12 DIAGNOSIS — H2511 Age-related nuclear cataract, right eye: Secondary | ICD-10-CM | POA: Diagnosis not present

## 2020-11-12 DIAGNOSIS — H04123 Dry eye syndrome of bilateral lacrimal glands: Secondary | ICD-10-CM | POA: Diagnosis not present

## 2021-01-24 DIAGNOSIS — H43811 Vitreous degeneration, right eye: Secondary | ICD-10-CM | POA: Diagnosis not present

## 2021-01-24 DIAGNOSIS — H3581 Retinal edema: Secondary | ICD-10-CM | POA: Diagnosis not present

## 2021-01-24 DIAGNOSIS — H35052 Retinal neovascularization, unspecified, left eye: Secondary | ICD-10-CM | POA: Diagnosis not present

## 2021-01-24 DIAGNOSIS — H35373 Puckering of macula, bilateral: Secondary | ICD-10-CM | POA: Diagnosis not present

## 2021-02-24 DIAGNOSIS — H26491 Other secondary cataract, right eye: Secondary | ICD-10-CM | POA: Diagnosis not present

## 2021-02-24 DIAGNOSIS — H35371 Puckering of macula, right eye: Secondary | ICD-10-CM | POA: Diagnosis not present

## 2021-02-24 DIAGNOSIS — H35372 Puckering of macula, left eye: Secondary | ICD-10-CM | POA: Diagnosis not present

## 2021-02-24 DIAGNOSIS — H3581 Retinal edema: Secondary | ICD-10-CM | POA: Diagnosis not present

## 2021-02-24 DIAGNOSIS — H26492 Other secondary cataract, left eye: Secondary | ICD-10-CM | POA: Diagnosis not present

## 2021-02-25 DIAGNOSIS — H3581 Retinal edema: Secondary | ICD-10-CM | POA: Diagnosis not present

## 2021-02-25 DIAGNOSIS — H35372 Puckering of macula, left eye: Secondary | ICD-10-CM | POA: Diagnosis not present

## 2021-03-04 DIAGNOSIS — Z23 Encounter for immunization: Secondary | ICD-10-CM | POA: Diagnosis not present

## 2021-03-04 DIAGNOSIS — H3581 Retinal edema: Secondary | ICD-10-CM | POA: Diagnosis not present

## 2021-03-04 DIAGNOSIS — H43811 Vitreous degeneration, right eye: Secondary | ICD-10-CM | POA: Diagnosis not present

## 2021-03-25 DIAGNOSIS — H43811 Vitreous degeneration, right eye: Secondary | ICD-10-CM | POA: Diagnosis not present

## 2021-03-25 DIAGNOSIS — H3581 Retinal edema: Secondary | ICD-10-CM | POA: Diagnosis not present

## 2021-03-25 DIAGNOSIS — H35372 Puckering of macula, left eye: Secondary | ICD-10-CM | POA: Diagnosis not present

## 2021-05-19 DIAGNOSIS — Z23 Encounter for immunization: Secondary | ICD-10-CM | POA: Diagnosis not present

## 2021-05-19 DIAGNOSIS — R634 Abnormal weight loss: Secondary | ICD-10-CM | POA: Diagnosis not present

## 2021-05-19 DIAGNOSIS — M545 Low back pain, unspecified: Secondary | ICD-10-CM | POA: Diagnosis not present

## 2021-05-19 DIAGNOSIS — K5909 Other constipation: Secondary | ICD-10-CM | POA: Diagnosis not present

## 2021-06-05 DIAGNOSIS — R634 Abnormal weight loss: Secondary | ICD-10-CM | POA: Diagnosis not present

## 2021-06-05 DIAGNOSIS — R933 Abnormal findings on diagnostic imaging of other parts of digestive tract: Secondary | ICD-10-CM | POA: Diagnosis not present

## 2021-06-05 DIAGNOSIS — Z1211 Encounter for screening for malignant neoplasm of colon: Secondary | ICD-10-CM | POA: Diagnosis not present

## 2021-06-18 DIAGNOSIS — R634 Abnormal weight loss: Secondary | ICD-10-CM | POA: Diagnosis not present

## 2021-06-18 DIAGNOSIS — Z1211 Encounter for screening for malignant neoplasm of colon: Secondary | ICD-10-CM | POA: Diagnosis not present

## 2021-06-26 DIAGNOSIS — Z1231 Encounter for screening mammogram for malignant neoplasm of breast: Secondary | ICD-10-CM | POA: Diagnosis not present

## 2021-07-01 DIAGNOSIS — L578 Other skin changes due to chronic exposure to nonionizing radiation: Secondary | ICD-10-CM | POA: Diagnosis not present

## 2021-07-01 DIAGNOSIS — M2570 Osteophyte, unspecified joint: Secondary | ICD-10-CM | POA: Diagnosis not present

## 2021-07-01 DIAGNOSIS — L570 Actinic keratosis: Secondary | ICD-10-CM | POA: Diagnosis not present

## 2021-07-01 DIAGNOSIS — L219 Seborrheic dermatitis, unspecified: Secondary | ICD-10-CM | POA: Diagnosis not present

## 2021-07-01 DIAGNOSIS — Z23 Encounter for immunization: Secondary | ICD-10-CM | POA: Diagnosis not present

## 2021-07-01 DIAGNOSIS — Z85828 Personal history of other malignant neoplasm of skin: Secondary | ICD-10-CM | POA: Diagnosis not present

## 2021-07-01 DIAGNOSIS — D225 Melanocytic nevi of trunk: Secondary | ICD-10-CM | POA: Diagnosis not present

## 2021-07-01 DIAGNOSIS — L821 Other seborrheic keratosis: Secondary | ICD-10-CM | POA: Diagnosis not present

## 2021-07-01 DIAGNOSIS — L814 Other melanin hyperpigmentation: Secondary | ICD-10-CM | POA: Diagnosis not present

## 2021-07-25 DIAGNOSIS — H35371 Puckering of macula, right eye: Secondary | ICD-10-CM | POA: Diagnosis not present

## 2021-07-25 DIAGNOSIS — H43811 Vitreous degeneration, right eye: Secondary | ICD-10-CM | POA: Diagnosis not present

## 2021-07-25 DIAGNOSIS — H2511 Age-related nuclear cataract, right eye: Secondary | ICD-10-CM | POA: Diagnosis not present

## 2021-07-25 DIAGNOSIS — H35052 Retinal neovascularization, unspecified, left eye: Secondary | ICD-10-CM | POA: Diagnosis not present

## 2021-07-26 IMAGING — CT CT ABD-PELV W/ CM
1 of 3 series · 14 of 32 positions shown, 19 images · IV contrast (APPLIED)
Comparison: None.

CLINICAL DATA: Concern for diverticulitis. LEFT lower quadrant pain
for several weeks.

EXAM:
CT ABDOMEN AND PELVIS WITH CONTRAST
TECHNIQUE: Multidetector CT imaging of the abdomen and pelvis was performed
using the standard protocol following bolus administration of
intravenous contrast.
CONTRAST:  100mL I496YV-HLL IOPAMIDOL (I496YV-HLL) INJECTION 61%

[Series 2: abd/pelvis w/cm · axial · 0.68mm/px · z∈[-604,-219]mm · 14 of 89 slices shown, 19 images]
[im 6/89  soft-tissue]
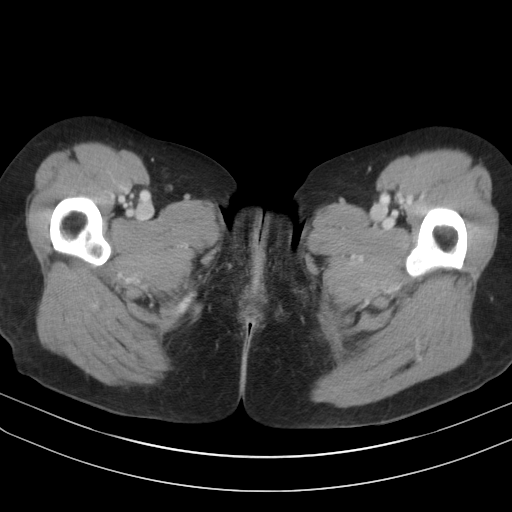
[im 6/89  bone]
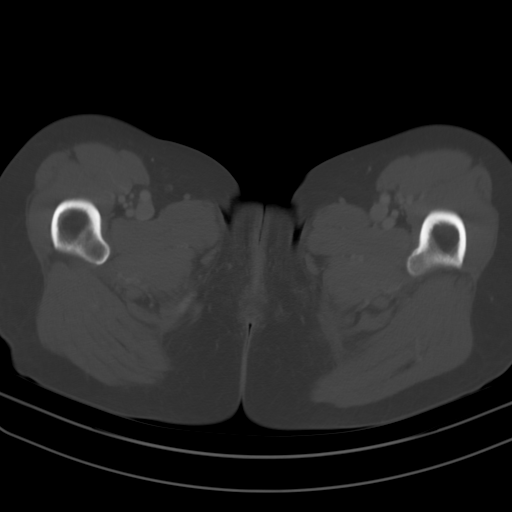
[im 12/89  soft-tissue]
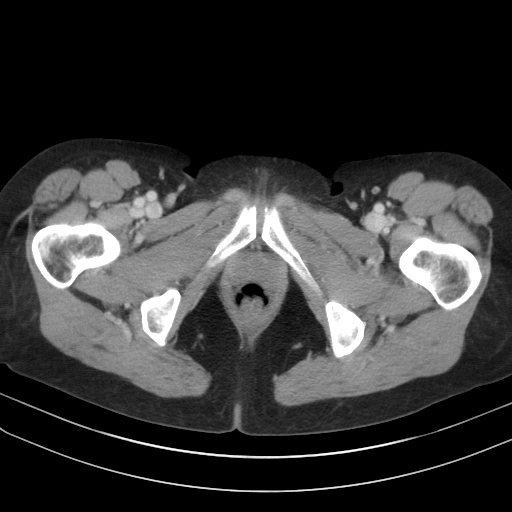
[im 17/89  soft-tissue]
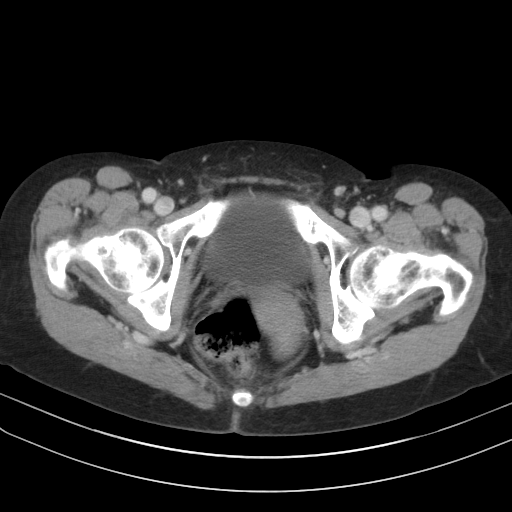
[im 28/89  soft-tissue]
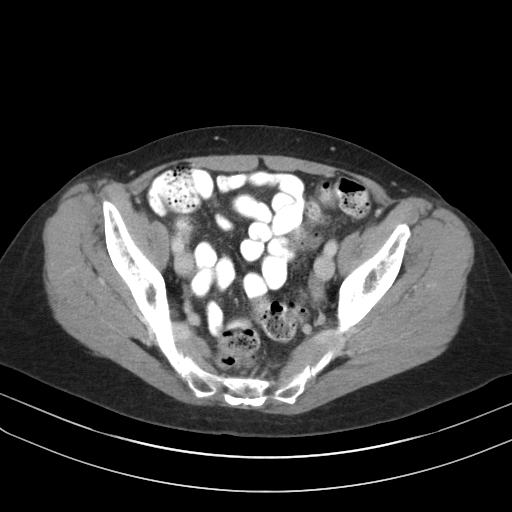
[im 34/89  soft-tissue]
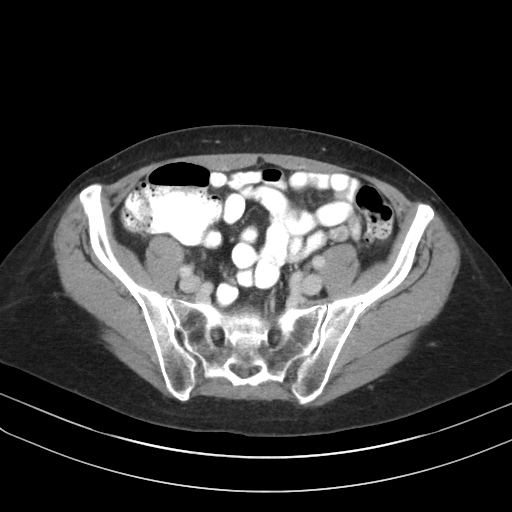
[im 39/89  soft-tissue]
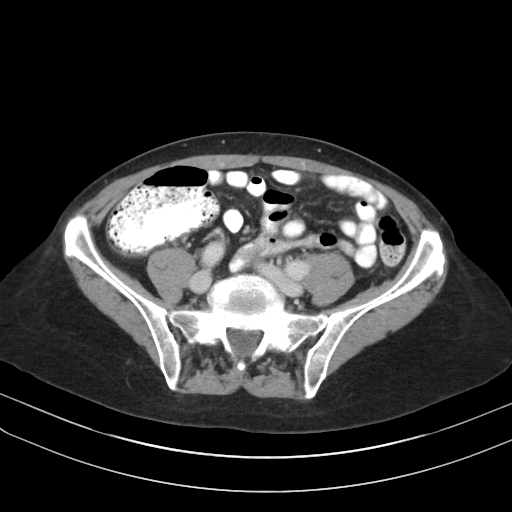
[im 45/89  soft-tissue]
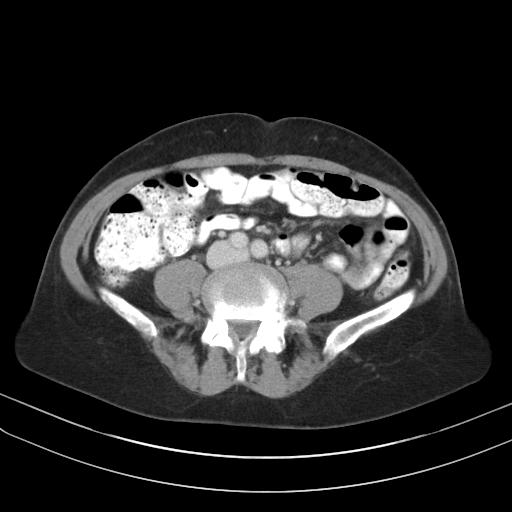
[im 50/89  soft-tissue]
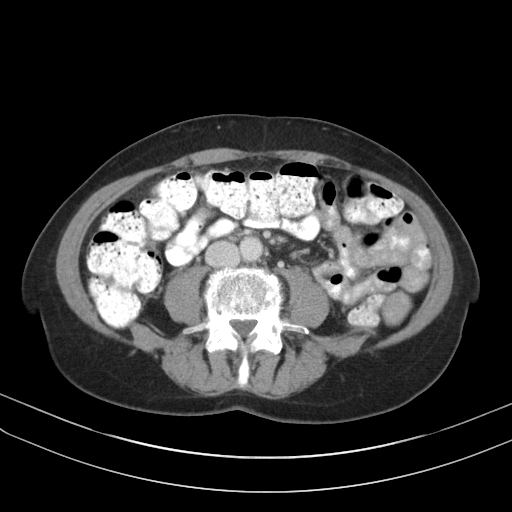
[im 56/89  soft-tissue]
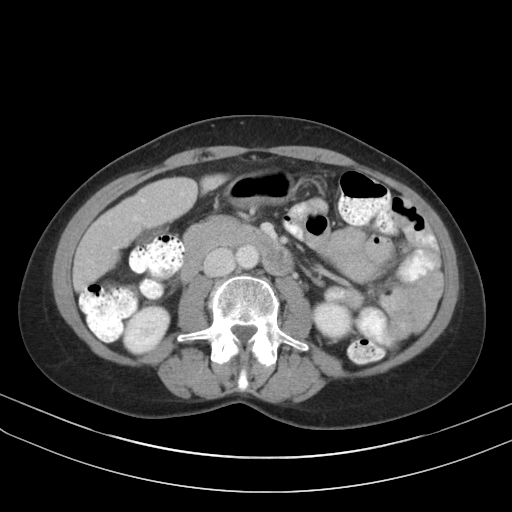
[im 56/89  bone]
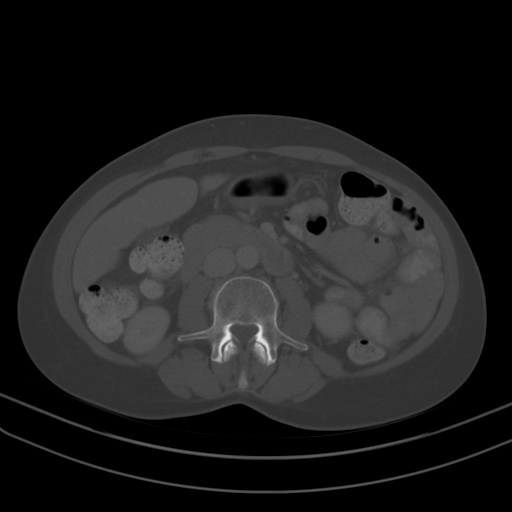
[im 61/89  soft-tissue]
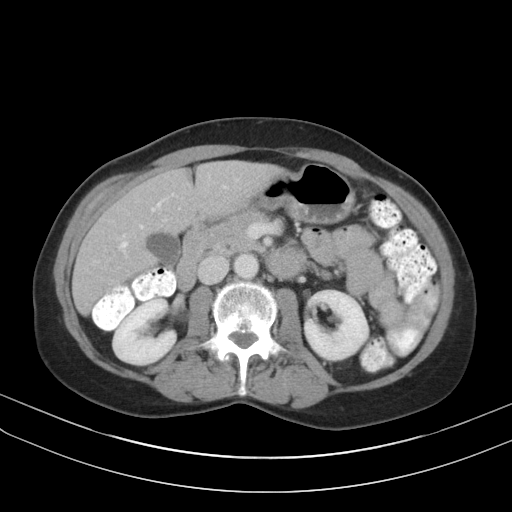
[im 67/89  lung]
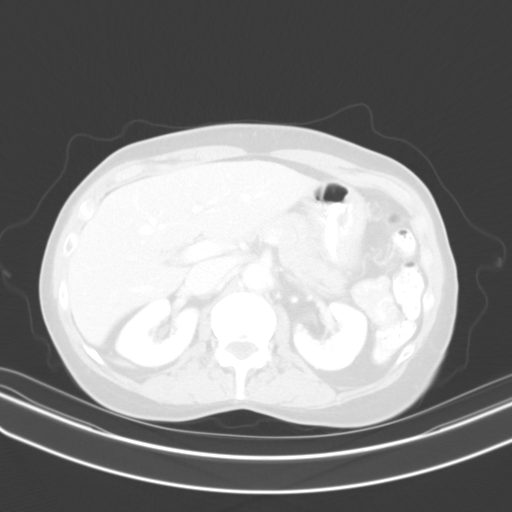
[im 72/89  soft-tissue]
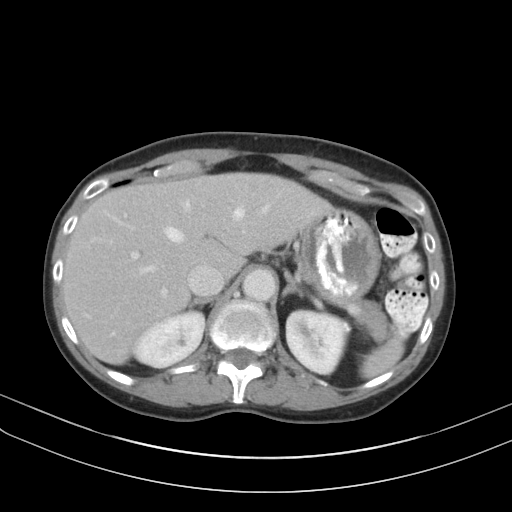
[im 72/89  lung]
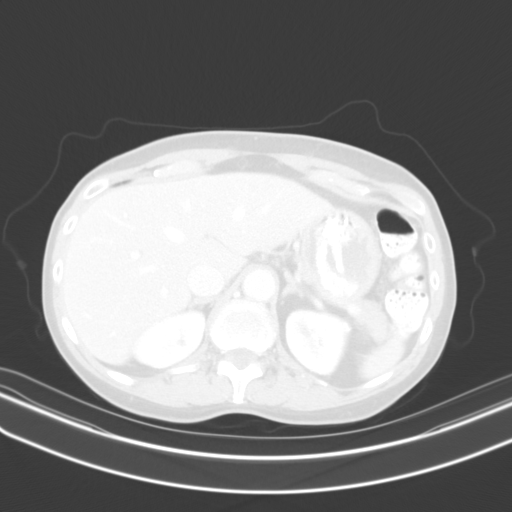
[im 78/89  soft-tissue]
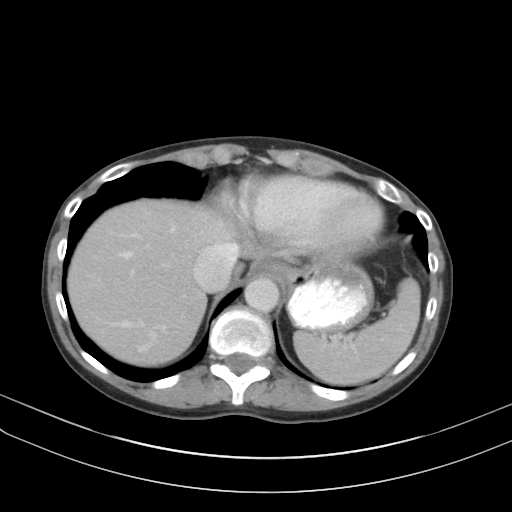
[im 78/89  lung]
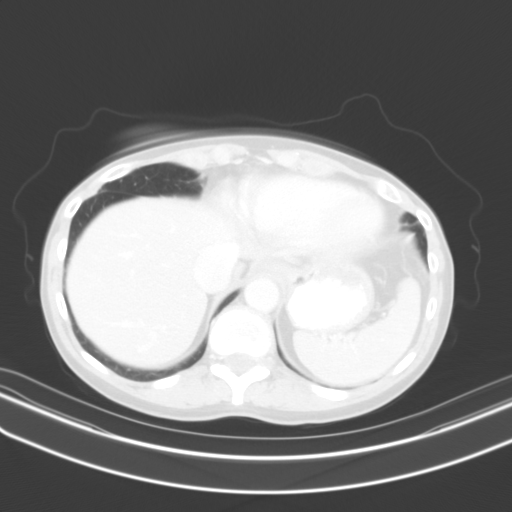
[im 83/89  soft-tissue]
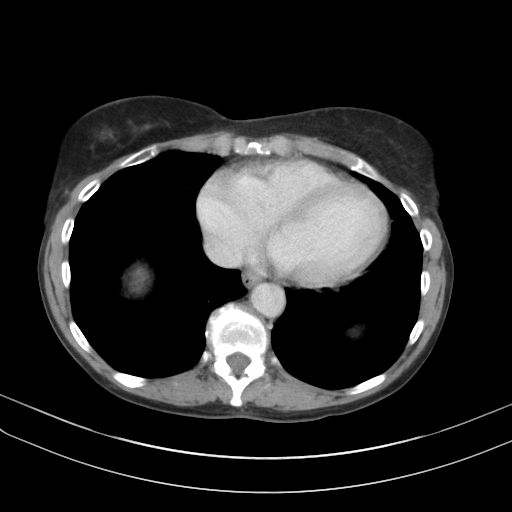
[im 83/89  lung]
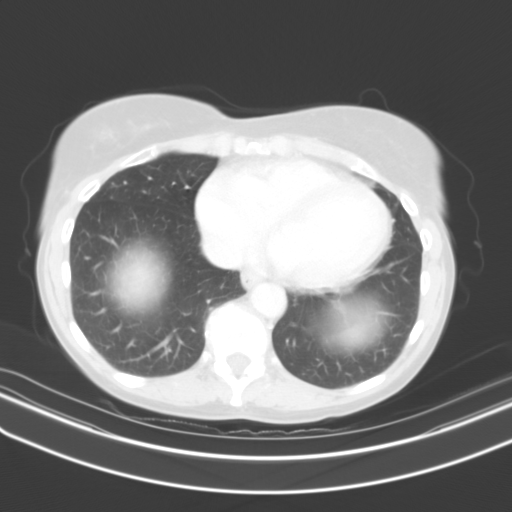

[14 of 32 positions shown; findings below may reference images not displayed]

FINDINGS: Lower chest: Lung bases are clear.

Hepatobiliary: No focal hepatic lesion. No biliary duct dilatation.
Gallbladder is normal. Common bile duct is normal.

Pancreas: Pancreas is normal. No ductal dilatation. No pancreatic
inflammation.

Spleen: Normal spleen

Adrenals/urinary tract: Adrenal glands and kidneys are normal.
Several small low-density cortical lesions LEFT and RIGHT kidney
most consistent with benign cysts. The ureters and bladder normal.

Stomach/Bowel: Stomach, small-bowel and cecum are normal. The
appendix is not identified but there is no pericecal inflammation to
suggest appendicitis. Cecum, ascending and transverse and descending
a have moderate volume stool. No significant diverticular disease of
the descending colon sigmoid colon. No inflammation. Rectum normal.

Vascular/Lymphatic: Abdominal aorta is normal caliber. No periportal
or retroperitoneal adenopathy. No pelvic adenopathy.

Reproductive: Uterus and ovaries are normal.

Other: No free fluid.  No significant abdominal hernia.

Musculoskeletal: No aggressive osseous lesion.
IMPRESSION: 1. No evidence acute diverticulitis.  No significant diverticulosis.
2. Moderate to large volume stool in the RIGHT colon could indicate
constipation. Recommend correlation
3. No ureterolithiasis or obstructive uropathy.
4. No good explanation for LEFT sided pain.

## 2021-07-28 DIAGNOSIS — U071 COVID-19: Secondary | ICD-10-CM | POA: Diagnosis not present

## 2021-07-28 DIAGNOSIS — Z23 Encounter for immunization: Secondary | ICD-10-CM | POA: Diagnosis not present

## 2021-10-08 DIAGNOSIS — U071 COVID-19: Secondary | ICD-10-CM | POA: Diagnosis not present

## 2021-11-17 DIAGNOSIS — U071 COVID-19: Secondary | ICD-10-CM | POA: Diagnosis not present

## 2021-11-24 DIAGNOSIS — H35033 Hypertensive retinopathy, bilateral: Secondary | ICD-10-CM | POA: Diagnosis not present

## 2021-11-24 DIAGNOSIS — M8588 Other specified disorders of bone density and structure, other site: Secondary | ICD-10-CM | POA: Diagnosis not present

## 2021-11-24 DIAGNOSIS — Z87898 Personal history of other specified conditions: Secondary | ICD-10-CM | POA: Diagnosis not present

## 2021-11-24 DIAGNOSIS — Z23 Encounter for immunization: Secondary | ICD-10-CM | POA: Diagnosis not present

## 2021-11-24 DIAGNOSIS — Z0000 Encounter for general adult medical examination without abnormal findings: Secondary | ICD-10-CM | POA: Diagnosis not present

## 2021-12-02 DIAGNOSIS — Z780 Asymptomatic menopausal state: Secondary | ICD-10-CM | POA: Diagnosis not present

## 2021-12-02 DIAGNOSIS — M85851 Other specified disorders of bone density and structure, right thigh: Secondary | ICD-10-CM | POA: Diagnosis not present

## 2021-12-02 DIAGNOSIS — M85852 Other specified disorders of bone density and structure, left thigh: Secondary | ICD-10-CM | POA: Diagnosis not present

## 2022-01-23 DIAGNOSIS — U071 COVID-19: Secondary | ICD-10-CM | POA: Diagnosis not present

## 2022-02-04 DIAGNOSIS — H35052 Retinal neovascularization, unspecified, left eye: Secondary | ICD-10-CM | POA: Diagnosis not present

## 2022-02-04 DIAGNOSIS — H2511 Age-related nuclear cataract, right eye: Secondary | ICD-10-CM | POA: Diagnosis not present

## 2022-02-04 DIAGNOSIS — H43811 Vitreous degeneration, right eye: Secondary | ICD-10-CM | POA: Diagnosis not present

## 2022-02-04 DIAGNOSIS — H35371 Puckering of macula, right eye: Secondary | ICD-10-CM | POA: Diagnosis not present

## 2022-04-12 DIAGNOSIS — S61211A Laceration without foreign body of left index finger without damage to nail, initial encounter: Secondary | ICD-10-CM | POA: Diagnosis not present

## 2022-05-09 DIAGNOSIS — U071 COVID-19: Secondary | ICD-10-CM | POA: Diagnosis not present

## 2022-07-03 DIAGNOSIS — Z1231 Encounter for screening mammogram for malignant neoplasm of breast: Secondary | ICD-10-CM | POA: Diagnosis not present

## 2022-07-14 DIAGNOSIS — L814 Other melanin hyperpigmentation: Secondary | ICD-10-CM | POA: Diagnosis not present

## 2022-07-14 DIAGNOSIS — D225 Melanocytic nevi of trunk: Secondary | ICD-10-CM | POA: Diagnosis not present

## 2022-07-14 DIAGNOSIS — L821 Other seborrheic keratosis: Secondary | ICD-10-CM | POA: Diagnosis not present

## 2022-07-14 DIAGNOSIS — L578 Other skin changes due to chronic exposure to nonionizing radiation: Secondary | ICD-10-CM | POA: Diagnosis not present

## 2022-07-14 DIAGNOSIS — Z85828 Personal history of other malignant neoplasm of skin: Secondary | ICD-10-CM | POA: Diagnosis not present

## 2022-08-06 DIAGNOSIS — Z23 Encounter for immunization: Secondary | ICD-10-CM | POA: Diagnosis not present

## 2022-08-07 DIAGNOSIS — H3581 Retinal edema: Secondary | ICD-10-CM | POA: Diagnosis not present

## 2022-08-07 DIAGNOSIS — H35052 Retinal neovascularization, unspecified, left eye: Secondary | ICD-10-CM | POA: Diagnosis not present

## 2022-08-07 DIAGNOSIS — H43811 Vitreous degeneration, right eye: Secondary | ICD-10-CM | POA: Diagnosis not present

## 2022-08-07 DIAGNOSIS — H35371 Puckering of macula, right eye: Secondary | ICD-10-CM | POA: Diagnosis not present

## 2022-10-08 DIAGNOSIS — Z23 Encounter for immunization: Secondary | ICD-10-CM | POA: Diagnosis not present

## 2023-02-10 DIAGNOSIS — H35371 Puckering of macula, right eye: Secondary | ICD-10-CM | POA: Diagnosis not present

## 2023-02-10 DIAGNOSIS — H35052 Retinal neovascularization, unspecified, left eye: Secondary | ICD-10-CM | POA: Diagnosis not present

## 2023-02-10 DIAGNOSIS — H2511 Age-related nuclear cataract, right eye: Secondary | ICD-10-CM | POA: Diagnosis not present

## 2023-02-10 DIAGNOSIS — H43813 Vitreous degeneration, bilateral: Secondary | ICD-10-CM | POA: Diagnosis not present

## 2023-03-29 DIAGNOSIS — Z23 Encounter for immunization: Secondary | ICD-10-CM | POA: Diagnosis not present

## 2023-06-30 DIAGNOSIS — Z23 Encounter for immunization: Secondary | ICD-10-CM | POA: Diagnosis not present

## 2023-07-16 DIAGNOSIS — Z1231 Encounter for screening mammogram for malignant neoplasm of breast: Secondary | ICD-10-CM | POA: Diagnosis not present

## 2023-08-03 DIAGNOSIS — D225 Melanocytic nevi of trunk: Secondary | ICD-10-CM | POA: Diagnosis not present

## 2023-08-03 DIAGNOSIS — L578 Other skin changes due to chronic exposure to nonionizing radiation: Secondary | ICD-10-CM | POA: Diagnosis not present

## 2023-08-03 DIAGNOSIS — L814 Other melanin hyperpigmentation: Secondary | ICD-10-CM | POA: Diagnosis not present

## 2023-08-03 DIAGNOSIS — Z85828 Personal history of other malignant neoplasm of skin: Secondary | ICD-10-CM | POA: Diagnosis not present

## 2023-08-03 DIAGNOSIS — L820 Inflamed seborrheic keratosis: Secondary | ICD-10-CM | POA: Diagnosis not present

## 2023-08-03 DIAGNOSIS — L570 Actinic keratosis: Secondary | ICD-10-CM | POA: Diagnosis not present

## 2023-08-03 DIAGNOSIS — L821 Other seborrheic keratosis: Secondary | ICD-10-CM | POA: Diagnosis not present

## 2023-08-18 DIAGNOSIS — H35052 Retinal neovascularization, unspecified, left eye: Secondary | ICD-10-CM | POA: Diagnosis not present

## 2023-08-18 DIAGNOSIS — H43813 Vitreous degeneration, bilateral: Secondary | ICD-10-CM | POA: Diagnosis not present

## 2023-08-18 DIAGNOSIS — H35379 Puckering of macula, unspecified eye: Secondary | ICD-10-CM | POA: Diagnosis not present

## 2023-08-18 DIAGNOSIS — H2511 Age-related nuclear cataract, right eye: Secondary | ICD-10-CM | POA: Diagnosis not present

## 2023-10-07 DIAGNOSIS — H0102A Squamous blepharitis right eye, upper and lower eyelids: Secondary | ICD-10-CM | POA: Diagnosis not present

## 2023-10-07 DIAGNOSIS — H26492 Other secondary cataract, left eye: Secondary | ICD-10-CM | POA: Diagnosis not present

## 2023-10-07 DIAGNOSIS — H1132 Conjunctival hemorrhage, left eye: Secondary | ICD-10-CM | POA: Diagnosis not present

## 2023-10-07 DIAGNOSIS — H2511 Age-related nuclear cataract, right eye: Secondary | ICD-10-CM | POA: Diagnosis not present

## 2023-10-07 DIAGNOSIS — H04123 Dry eye syndrome of bilateral lacrimal glands: Secondary | ICD-10-CM | POA: Diagnosis not present

## 2023-10-07 DIAGNOSIS — H0102B Squamous blepharitis left eye, upper and lower eyelids: Secondary | ICD-10-CM | POA: Diagnosis not present

## 2023-10-07 DIAGNOSIS — H1012 Acute atopic conjunctivitis, left eye: Secondary | ICD-10-CM | POA: Diagnosis not present

## 2023-10-14 DIAGNOSIS — Z23 Encounter for immunization: Secondary | ICD-10-CM | POA: Diagnosis not present

## 2024-01-03 DIAGNOSIS — E785 Hyperlipidemia, unspecified: Secondary | ICD-10-CM | POA: Diagnosis not present

## 2024-01-03 DIAGNOSIS — M79644 Pain in right finger(s): Secondary | ICD-10-CM | POA: Diagnosis not present

## 2024-01-03 DIAGNOSIS — Z0000 Encounter for general adult medical examination without abnormal findings: Secondary | ICD-10-CM | POA: Diagnosis not present

## 2024-01-03 DIAGNOSIS — I10 Essential (primary) hypertension: Secondary | ICD-10-CM | POA: Diagnosis not present

## 2024-01-03 DIAGNOSIS — H35033 Hypertensive retinopathy, bilateral: Secondary | ICD-10-CM | POA: Diagnosis not present

## 2024-01-03 DIAGNOSIS — M8588 Other specified disorders of bone density and structure, other site: Secondary | ICD-10-CM | POA: Diagnosis not present

## 2024-01-03 DIAGNOSIS — E559 Vitamin D deficiency, unspecified: Secondary | ICD-10-CM | POA: Diagnosis not present

## 2024-01-03 DIAGNOSIS — M79645 Pain in left finger(s): Secondary | ICD-10-CM | POA: Diagnosis not present

## 2024-01-03 DIAGNOSIS — Z87898 Personal history of other specified conditions: Secondary | ICD-10-CM | POA: Diagnosis not present

## 2024-01-27 DIAGNOSIS — M180 Bilateral primary osteoarthritis of first carpometacarpal joints: Secondary | ICD-10-CM | POA: Diagnosis not present

## 2024-02-16 DIAGNOSIS — H43813 Vitreous degeneration, bilateral: Secondary | ICD-10-CM | POA: Diagnosis not present

## 2024-02-16 DIAGNOSIS — H35052 Retinal neovascularization, unspecified, left eye: Secondary | ICD-10-CM | POA: Diagnosis not present

## 2024-02-16 DIAGNOSIS — H2511 Age-related nuclear cataract, right eye: Secondary | ICD-10-CM | POA: Diagnosis not present

## 2024-02-16 DIAGNOSIS — H35373 Puckering of macula, bilateral: Secondary | ICD-10-CM | POA: Diagnosis not present

## 2024-03-07 DIAGNOSIS — H35052 Retinal neovascularization, unspecified, left eye: Secondary | ICD-10-CM | POA: Diagnosis not present

## 2024-03-14 DIAGNOSIS — H43813 Vitreous degeneration, bilateral: Secondary | ICD-10-CM | POA: Diagnosis not present

## 2024-03-14 DIAGNOSIS — H35052 Retinal neovascularization, unspecified, left eye: Secondary | ICD-10-CM | POA: Diagnosis not present

## 2024-03-14 DIAGNOSIS — H2511 Age-related nuclear cataract, right eye: Secondary | ICD-10-CM | POA: Diagnosis not present

## 2024-03-14 DIAGNOSIS — H35373 Puckering of macula, bilateral: Secondary | ICD-10-CM | POA: Diagnosis not present

## 2024-04-05 DIAGNOSIS — E785 Hyperlipidemia, unspecified: Secondary | ICD-10-CM | POA: Diagnosis not present

## 2024-04-19 DIAGNOSIS — H35373 Puckering of macula, bilateral: Secondary | ICD-10-CM | POA: Diagnosis not present

## 2024-04-19 DIAGNOSIS — H43811 Vitreous degeneration, right eye: Secondary | ICD-10-CM | POA: Diagnosis not present

## 2024-04-19 DIAGNOSIS — H2511 Age-related nuclear cataract, right eye: Secondary | ICD-10-CM | POA: Diagnosis not present

## 2024-04-19 DIAGNOSIS — H1012 Acute atopic conjunctivitis, left eye: Secondary | ICD-10-CM | POA: Diagnosis not present

## 2024-04-19 DIAGNOSIS — H26492 Other secondary cataract, left eye: Secondary | ICD-10-CM | POA: Diagnosis not present

## 2024-04-19 DIAGNOSIS — H04123 Dry eye syndrome of bilateral lacrimal glands: Secondary | ICD-10-CM | POA: Diagnosis not present

## 2024-05-02 DIAGNOSIS — H2511 Age-related nuclear cataract, right eye: Secondary | ICD-10-CM | POA: Diagnosis not present

## 2024-05-02 DIAGNOSIS — H35052 Retinal neovascularization, unspecified, left eye: Secondary | ICD-10-CM | POA: Diagnosis not present

## 2024-05-02 DIAGNOSIS — H35373 Puckering of macula, bilateral: Secondary | ICD-10-CM | POA: Diagnosis not present

## 2024-05-02 DIAGNOSIS — H43813 Vitreous degeneration, bilateral: Secondary | ICD-10-CM | POA: Diagnosis not present

## 2024-05-17 DIAGNOSIS — M79644 Pain in right finger(s): Secondary | ICD-10-CM | POA: Diagnosis not present

## 2024-05-17 DIAGNOSIS — M79645 Pain in left finger(s): Secondary | ICD-10-CM | POA: Diagnosis not present

## 2024-05-17 DIAGNOSIS — M180 Bilateral primary osteoarthritis of first carpometacarpal joints: Secondary | ICD-10-CM | POA: Diagnosis not present

## 2024-06-27 DIAGNOSIS — H43813 Vitreous degeneration, bilateral: Secondary | ICD-10-CM | POA: Diagnosis not present

## 2024-06-27 DIAGNOSIS — H35373 Puckering of macula, bilateral: Secondary | ICD-10-CM | POA: Diagnosis not present

## 2024-06-27 DIAGNOSIS — H2511 Age-related nuclear cataract, right eye: Secondary | ICD-10-CM | POA: Diagnosis not present

## 2024-06-27 DIAGNOSIS — H35052 Retinal neovascularization, unspecified, left eye: Secondary | ICD-10-CM | POA: Diagnosis not present

## 2024-07-21 DIAGNOSIS — M8589 Other specified disorders of bone density and structure, multiple sites: Secondary | ICD-10-CM | POA: Diagnosis not present

## 2024-07-21 DIAGNOSIS — Z1231 Encounter for screening mammogram for malignant neoplasm of breast: Secondary | ICD-10-CM | POA: Diagnosis not present

## 2024-08-22 DIAGNOSIS — H35373 Puckering of macula, bilateral: Secondary | ICD-10-CM | POA: Diagnosis not present

## 2024-08-22 DIAGNOSIS — H43813 Vitreous degeneration, bilateral: Secondary | ICD-10-CM | POA: Diagnosis not present

## 2024-08-22 DIAGNOSIS — H35052 Retinal neovascularization, unspecified, left eye: Secondary | ICD-10-CM | POA: Diagnosis not present

## 2024-08-22 DIAGNOSIS — H2511 Age-related nuclear cataract, right eye: Secondary | ICD-10-CM | POA: Diagnosis not present

## 2024-08-26 DIAGNOSIS — Z23 Encounter for immunization: Secondary | ICD-10-CM | POA: Diagnosis not present

## 2024-08-29 DIAGNOSIS — D225 Melanocytic nevi of trunk: Secondary | ICD-10-CM | POA: Diagnosis not present

## 2024-08-29 DIAGNOSIS — D485 Neoplasm of uncertain behavior of skin: Secondary | ICD-10-CM | POA: Diagnosis not present

## 2024-08-29 DIAGNOSIS — L814 Other melanin hyperpigmentation: Secondary | ICD-10-CM | POA: Diagnosis not present

## 2024-08-29 DIAGNOSIS — L821 Other seborrheic keratosis: Secondary | ICD-10-CM | POA: Diagnosis not present

## 2024-08-29 DIAGNOSIS — L578 Other skin changes due to chronic exposure to nonionizing radiation: Secondary | ICD-10-CM | POA: Diagnosis not present

## 2024-08-29 DIAGNOSIS — L570 Actinic keratosis: Secondary | ICD-10-CM | POA: Diagnosis not present

## 2024-08-29 DIAGNOSIS — Z85828 Personal history of other malignant neoplasm of skin: Secondary | ICD-10-CM | POA: Diagnosis not present
